# Patient Record
Sex: Female | Born: 1965 | Race: White | Hispanic: No | Marital: Married | Smoking: Never smoker
Health system: Southern US, Community
[De-identification: ages and names within clinical notes are randomized; demographics above are authoritative.]

## PROBLEM LIST (undated history)

## (undated) DIAGNOSIS — I10 Essential (primary) hypertension: Secondary | ICD-10-CM

## (undated) HISTORY — PX: TONSILLECTOMY: SUR1361

## (undated) HISTORY — DX: Essential (primary) hypertension: I10

---

## 2004-09-04 ENCOUNTER — Other Ambulatory Visit: Admission: RE | Admit: 2004-09-04 | Discharge: 2004-09-04 | Payer: Self-pay | Admitting: Obstetrics and Gynecology

## 2008-01-02 ENCOUNTER — Encounter: Admission: RE | Admit: 2008-01-02 | Discharge: 2008-01-02 | Payer: Self-pay | Admitting: Obstetrics and Gynecology

## 2010-12-31 ENCOUNTER — Encounter: Payer: Self-pay | Admitting: Obstetrics and Gynecology

## 2016-03-01 ENCOUNTER — Other Ambulatory Visit: Payer: Self-pay | Admitting: Physician Assistant

## 2016-03-01 DIAGNOSIS — N63 Unspecified lump in unspecified breast: Secondary | ICD-10-CM

## 2016-03-07 ENCOUNTER — Ambulatory Visit
Admission: RE | Admit: 2016-03-07 | Discharge: 2016-03-07 | Disposition: A | Discharge status: Home / Self Care | Payer: Managed Care, Other (non HMO) | Source: Ambulatory Visit | Attending: Physician Assistant | Admitting: Physician Assistant

## 2016-03-07 DIAGNOSIS — N63 Unspecified lump in unspecified breast: Secondary | ICD-10-CM

## 2016-07-16 ENCOUNTER — Other Ambulatory Visit: Payer: Self-pay | Admitting: Family Medicine

## 2016-11-16 ENCOUNTER — Other Ambulatory Visit: Payer: Self-pay | Admitting: Family Medicine

## 2019-07-20 ENCOUNTER — Other Ambulatory Visit: Payer: Self-pay

## 2019-07-21 ENCOUNTER — Ambulatory Visit: Payer: Managed Care, Other (non HMO) | Admitting: Women's Health

## 2019-07-21 ENCOUNTER — Encounter: Payer: Self-pay | Admitting: Women's Health

## 2019-07-21 VITALS — BP 128/80 | Ht 65.0 in | Wt 151.0 lb

## 2019-07-21 DIAGNOSIS — Z01419 Encounter for gynecological examination (general) (routine) without abnormal findings: Secondary | ICD-10-CM | POA: Diagnosis not present

## 2019-07-21 NOTE — Progress Notes (Signed)
Patricia Rhodes 1966-11-10 [id number]    History:    Presents for annual exam.  Postmenopausal greater than 1 year on no HRT with no bleeding.  Primary care manages hypertension and low vitamin D.  Recent labs.  Reports normal Pap and mammogram history.  Has not had a screening colonoscopy.  Past medical history, past surgical history, family history and social history were all reviewed and documented in the EPIC chart.  Recently laid off from [employer].  3 children ages 55, 45 and 55 all doing well.  Originally from [place].  Mother deceased from breast cancer.  ROS:  A ROS was performed and pertinent positives and negatives are included.  Exam:  Vitals:   07/21/19 1413  BP: 128/80  Weight: 151 lb (68.5 kg)  Height: 5\' 5"  (1.651 m)   Body mass index is 25.13 kg/m.   General appearance:  Normal Thyroid:  Symmetrical, normal in size, without palpable masses or nodularity. Respiratory  Auscultation:  Clear without wheezing or rhonchi Cardiovascular  Auscultation:  Regular rate, without rubs, murmurs or gallops  Edema/varicosities:  Not grossly evident Abdominal  Soft,nontender, without masses, guarding or rebound.  Liver/spleen:  No organomegaly noted  Hernia:  None appreciated  Skin  Inspection:  Grossly normal   Breasts: Examined lying and sitting.     Right: Without masses, retractions, discharge or axillary adenopathy.     Left: Without masses, retractions, discharge or axillary adenopathy. Gentitourinary   Inguinal/mons:  Normal without inguinal adenopathy  External genitalia:  Normal  BUS/Urethra/Skene's glands:  Normal  Vagina:  Normal  Cervix:  Normal  Uterus:   normal in size, shape and contour.  Midline and mobile  Adnexa/parametria:     Rt: Without masses or tenderness.   Lt: Without masses or tenderness.  Anus and perineum: Normal  Digital rectal exam: Normal sphincter tone without palpated masses or tenderness  Assessment/Plan:  53 y.o. MWF G3, P3 for  annual exam with no complaints.  Postmenopausal/no HRT/no bleeding Hypertension, low vitamin D-primary care manages labs and meds  Plan: SBEs, reviewed importance of annual screening mammogram overdue, has had a breast center in the past breast center number given instructed to schedule.  Encouraged r cardio /weightbearing and balance type exercise, after completing vitamin D 50,000 for 12 weeks encouraged to take 2000 IUs daily, calcium rich foods.  Review DEXA will get after menopausal for few years.  Screening colonoscopy discussed [facility] information given instructed to schedule.  Pap with HR HPV typing, new screening guidelines reviewed.[name] [facility], 2:31 PM 07/21/2019

## 2019-07-21 NOTE — Patient Instructions (Addendum)
Colonoscopy  [facility] Dr Carlean Purl  [phone] Breast mammogram  [phone]  Health Maintenance for Postmenopausal Women Menopause is a normal process in which your ability to get pregnant comes to an end. This process happens slowly over many months or years, usually between the ages of 64 and 24. Menopause is complete when you have missed your menstrual periods for 12 months. It is important to talk with your health care provider about some of the most common conditions that affect women after menopause (postmenopausal women). These include heart disease, cancer, and bone loss (osteoporosis). Adopting a healthy lifestyle and getting preventive care can help to promote your health and wellness. The actions you take can also lower your chances of developing some of these common conditions. What should I know about menopause? During menopause, you may get a number of symptoms, such as:  Hot flashes. These can be moderate or severe.  Night sweats.  Decrease in sex drive.  Mood swings.  Headaches.  Tiredness.  Irritability.  Memory problems.  Insomnia. Choosing to treat or not to treat these symptoms is a decision that you make with your health care provider. Do I need hormone replacement therapy?  Hormone replacement therapy is effective in treating symptoms that are caused by menopause, such as hot flashes and night sweats.  Hormone replacement carries certain risks, especially as you become older. If you are thinking about using estrogen or estrogen with progestin, discuss the benefits and risks with your health care provider. What is my risk for heart disease and stroke? The risk of heart disease, heart attack, and stroke increases as you age. One of the causes may be a change in the body's hormones during menopause. This can affect how your body uses dietary fats, triglycerides, and cholesterol. Heart attack and stroke are medical emergencies. There are many things that you can do to  help prevent heart disease and stroke. Watch your blood pressure  High blood pressure causes heart disease and increases the risk of stroke. This is more likely to develop in people who have high blood pressure readings, are of African descent, or are overweight.  Have your blood pressure checked: ? Every 3-5 years if you are 73-17 years of age. ? Every year if you are 18 years old or older. Eat a healthy diet   Eat a diet that includes plenty of vegetables, fruits, low-fat dairy products, and lean protein.  Do not eat a lot of foods that are high in solid fats, added sugars, or sodium. Get regular exercise Get regular exercise. This is one of the most important things you can do for your health. Most adults should:  Try to exercise for at least 150 minutes each week. The exercise should increase your heart rate and make you sweat (moderate-intensity exercise).  Try to do strengthening exercises at least twice each week. Do these in addition to the moderate-intensity exercise.  Spend less time sitting. Even light physical activity can be beneficial. Other tips  Work with your health care provider to achieve or maintain a healthy weight.  Do not use any products that contain nicotine or tobacco, such as cigarettes, e-cigarettes, and chewing tobacco. If you need help quitting, ask your health care provider.  Know your numbers. Ask your health care provider to check your cholesterol and your blood sugar (glucose). Continue to have your blood tested as directed by your health care provider. Do I need screening for cancer? Depending on your health history and family history, you  may need to have cancer screening at different stages of your life. This may include screening for:  Breast cancer.  Cervical cancer.  Lung cancer.  Colorectal cancer. What is my risk for osteoporosis? After menopause, you may be at increased risk for osteoporosis. Osteoporosis is a condition in which bone  destruction happens more quickly than new bone creation. To help prevent osteoporosis or the bone fractures that can happen because of osteoporosis, you may take the following actions:  If you are 42-4 years old, get at least 1,000 mg of calcium and at least 600 mg of vitamin D per day.  If you are older than age 44 but younger than age 20, get at least 1,200 mg of calcium and at least 600 mg of vitamin D per day.  If you are older than age 58, get at least 1,200 mg of calcium and at least 800 mg of vitamin D per day. Smoking and drinking excessive alcohol increase the risk of osteoporosis. Eat foods that are rich in calcium and vitamin D, and do weight-bearing exercises several times each week as directed by your health care provider. How does menopause affect my mental health? Depression may occur at any age, but it is more common as you become older. Common symptoms of depression include:  Low or sad mood.  Changes in sleep patterns.  Changes in appetite or eating patterns.  Feeling an overall lack of motivation or enjoyment of activities that you previously enjoyed.  Frequent crying spells. Talk with your health care provider if you think that you are experiencing depression. General instructions See your health care provider for regular wellness exams and vaccines. This may include:  Scheduling regular health, dental, and eye exams.  Getting and maintaining your vaccines. These include: ? Influenza vaccine. Get this vaccine each year before the flu season begins. ? Pneumonia vaccine. ? Shingles vaccine. ? Tetanus, diphtheria, and pertussis (Tdap) booster vaccine. Your health care provider may also recommend other immunizations. Tell your health care provider if you have ever been abused or do not feel safe at home. Summary  Menopause is a normal process in which your ability to get pregnant comes to an end.  This condition causes hot flashes, night sweats, decreased  interest in sex, mood swings, headaches, or lack of sleep.  Treatment for this condition may include hormone replacement therapy.  Take actions to keep yourself healthy, including exercising regularly, eating a healthy diet, watching your weight, and checking your blood pressure and blood sugar levels.  Get screened for cancer and depression. Make sure that you are up to date with all your vaccines. This information is not intended to replace advice given to you by your health care provider. Make sure you discuss any questions you have with your health care provider. Document Released: 01/18/2006 Document Revised: 11/19/2018 Document Reviewed: 11/19/2018 Elsevier Patient Education  [place]. Human Papillomavirus Quadrivalent Vaccine suspension for injection What is this medicine? HUMAN PAPILLOMAVIRUS VACCINE (HYOO muhn pap uh LOH muh vahy ruhs vak SEEN) is a vaccine. It is used to prevent infections of four types of the human papillomavirus. In women, the vaccine may lower your risk of getting cervical, vaginal, vulvar, or anal cancer and genital warts. In men, the vaccine may lower your risk of getting genital warts and anal cancer. You cannot get these diseases from the vaccine. This vaccine does not treat these diseases. This medicine may be used for other purposes; ask your health care provider or pharmacist  if you have questions. COMMON BRAND NAME(S): Gardasil What should I tell my health care provider before I take this medicine? They need to know if you have any of these conditions:  fever or infection  hemophilia  HIV infection or AIDS  immune system problems  low platelet count  an unusual reaction to Human Papillomavirus Vaccine, yeast, other medicines, foods, dyes, or preservatives  pregnant or trying to get pregnant  breast-feeding How should I use this medicine? This vaccine is for injection in a muscle on your upper arm or thigh. It is given by a health care  professional. Dennis Bast will be observed for 15 minutes after each dose. Sometimes, fainting happens after the vaccine is given. You may be asked to sit or lie down during the 15 minutes. Three doses are given. The second dose is given 2 months after the first dose. The last dose is given 4 months after the second dose. A copy of a Vaccine Information Statement will be given before each vaccination. Read this sheet carefully each time. The sheet may change frequently. Talk to your pediatrician regarding the use of this medicine in children. While this drug may be prescribed for children as Lamica Mccart as 47 years of age for selected conditions, precautions do apply. Overdosage: If you think you have taken too much of this medicine contact a poison control center or emergency room at once. NOTE: This medicine is only for you. Do not share this medicine with others. What if I miss a dose? All 3 doses of the vaccine should be given within 6 months. Remember to keep appointments for follow-up doses. Your health care provider will tell you when to return for the next vaccine. Ask your health care professional for advice if you are unable to keep an appointment or miss a scheduled dose. What may interact with this medicine?  other vaccines This list may not describe all possible interactions. Give your health care provider a list of all the medicines, herbs, non-prescription drugs, or dietary supplements you use. Also tell them if you smoke, drink alcohol, or use illegal drugs. Some items may interact with your medicine. What should I watch for while using this medicine? This vaccine may not fully protect everyone. Continue to have regular pelvic exams and cervical or anal cancer screenings as directed by your doctor. The Human Papillomavirus is a sexually transmitted disease. It can be passed by any kind of sexual activity that involves genital contact. The vaccine works best when given before you have any contact with  the virus. Many people who have the virus do not have any signs or symptoms. Tell your doctor or health care professional if you have any reaction or unusual symptom after getting the vaccine. What side effects may I notice from receiving this medicine? Side effects that you should report to your doctor or health care professional as soon as possible:  allergic reactions like skin rash, itching or hives, swelling of the face, lips, or tongue  breathing problems  feeling faint or lightheaded, falls Side effects that usually do not require medical attention (report to your doctor or health care professional if they continue or are bothersome):  cough  dizziness  fever  headache  nausea  redness, warmth, swelling, pain, or itching at site where injected This list may not describe all possible side effects. Call your doctor for medical advice about side effects. You may report side effects to FDA at 1-800-FDA-1088. Where should I keep my medicine? This  drug is given in a hospital or clinic and will not be stored at home. NOTE: This sheet is a summary. It may not cover all possible information. If you have questions about this medicine, talk to your doctor, pharmacist, or health care provider.  2020 Elsevier/Gold Standard (2014-01-18 13:14:33)

## 2019-07-22 LAB — PAP, TP IMAGING W/ HPV RNA, RFLX HPV TYPE 16,18/45: HPV DNA High Risk: NOT DETECTED

## 2020-02-02 ENCOUNTER — Other Ambulatory Visit: Payer: Self-pay | Admitting: Family Medicine

## 2020-02-02 DIAGNOSIS — Z1231 Encounter for screening mammogram for malignant neoplasm of breast: Secondary | ICD-10-CM

## 2020-02-03 ENCOUNTER — Other Ambulatory Visit: Payer: Self-pay

## 2020-02-03 ENCOUNTER — Ambulatory Visit
Admission: RE | Admit: 2020-02-03 | Discharge: 2020-02-03 | Disposition: A | Discharge status: Home / Self Care | Payer: Managed Care, Other (non HMO) | Source: Ambulatory Visit | Attending: Family Medicine | Admitting: Family Medicine

## 2020-02-03 DIAGNOSIS — Z1231 Encounter for screening mammogram for malignant neoplasm of breast: Secondary | ICD-10-CM

## 2021-01-02 ENCOUNTER — Other Ambulatory Visit: Payer: Self-pay | Admitting: Family Medicine

## 2021-01-02 DIAGNOSIS — Z1231 Encounter for screening mammogram for malignant neoplasm of breast: Secondary | ICD-10-CM

## 2021-02-13 ENCOUNTER — Ambulatory Visit: Payer: Managed Care, Other (non HMO)

## 2021-03-31 ENCOUNTER — Other Ambulatory Visit: Payer: Self-pay

## 2021-03-31 ENCOUNTER — Ambulatory Visit
Admission: RE | Admit: 2021-03-31 | Discharge: 2021-03-31 | Disposition: A | Discharge status: Home / Self Care | Payer: No Typology Code available for payment source | Source: Ambulatory Visit | Attending: Family Medicine | Admitting: Family Medicine

## 2021-03-31 DIAGNOSIS — Z1231 Encounter for screening mammogram for malignant neoplasm of breast: Secondary | ICD-10-CM

## 2022-10-15 IMAGING — MG MM DIGITAL SCREENING BILAT W/ TOMO AND CAD
7 series · 8 of 23 positions shown · non-contrast
Comparison: Previous exam(s).

CLINICAL DATA: Screening.

EXAM:
DIGITAL SCREENING BILATERAL MAMMOGRAM WITH TOMOSYNTHESIS AND CAD
TECHNIQUE: Bilateral screening digital craniocaudal and mediolateral oblique
mammograms were obtained. Bilateral screening digital breast
tomosynthesis was performed. The images were evaluated with
computer-aided detection.

[L MLO synth-2D (1 of 2)]
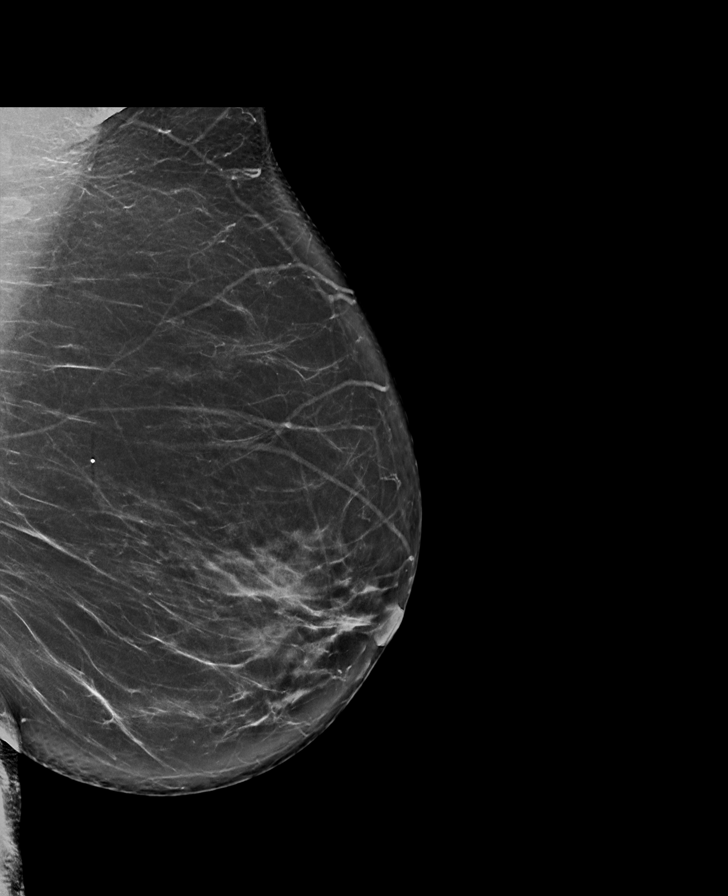

[L MLO synth-2D (2 of 2)]
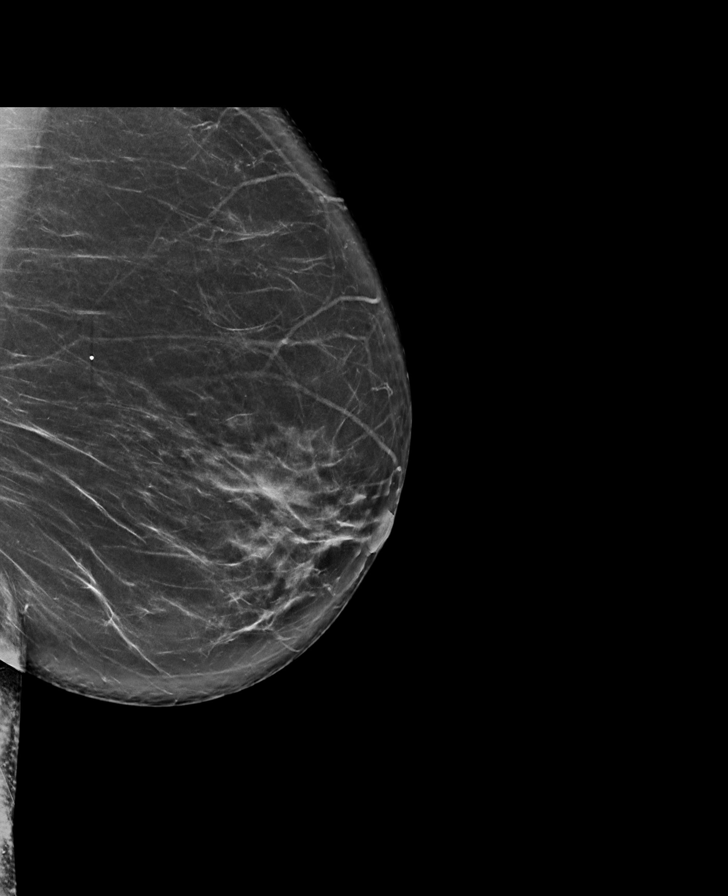

[L CC synth-2D]
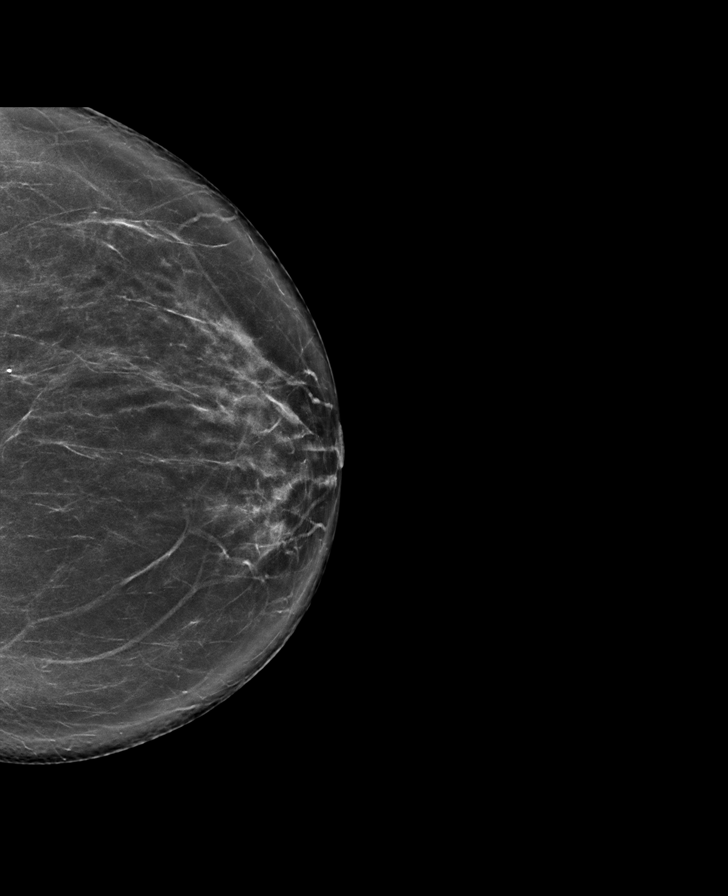

[L MLO tomo · 2 of 87 frames shown (1 of 2)]
[frame 29/87]
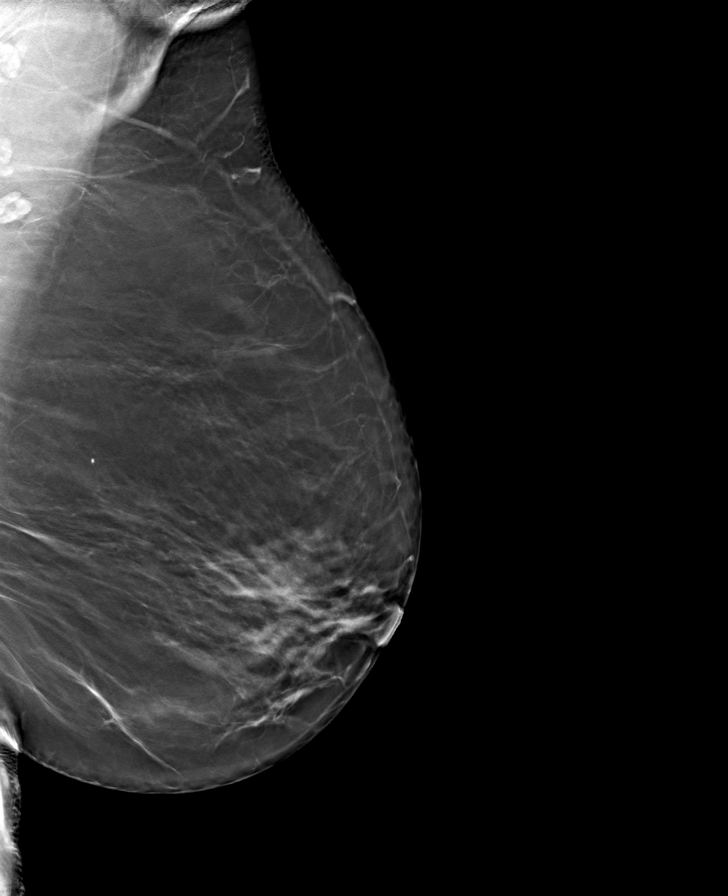
[frame 44/87]
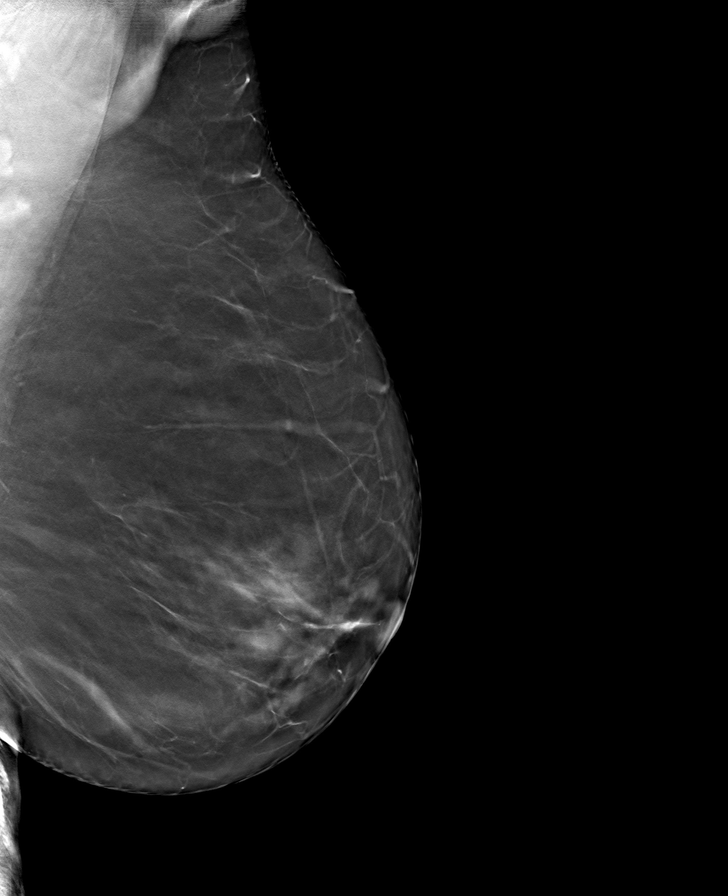

[L MLO tomo (2 of 2) · tomo slice 44/87.0]
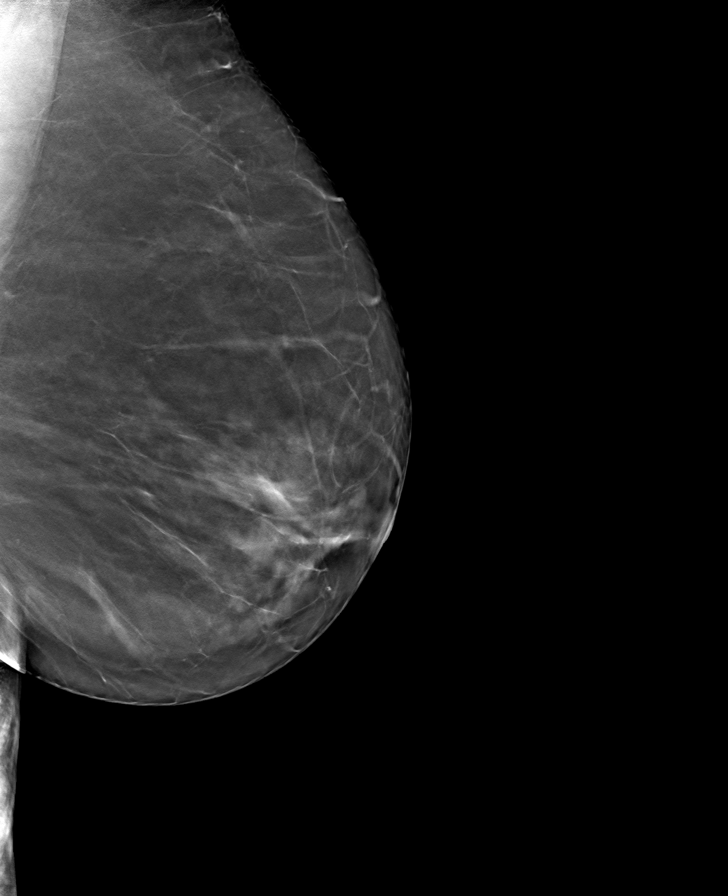

[R MLO tomo · tomo slice 46/91.0]
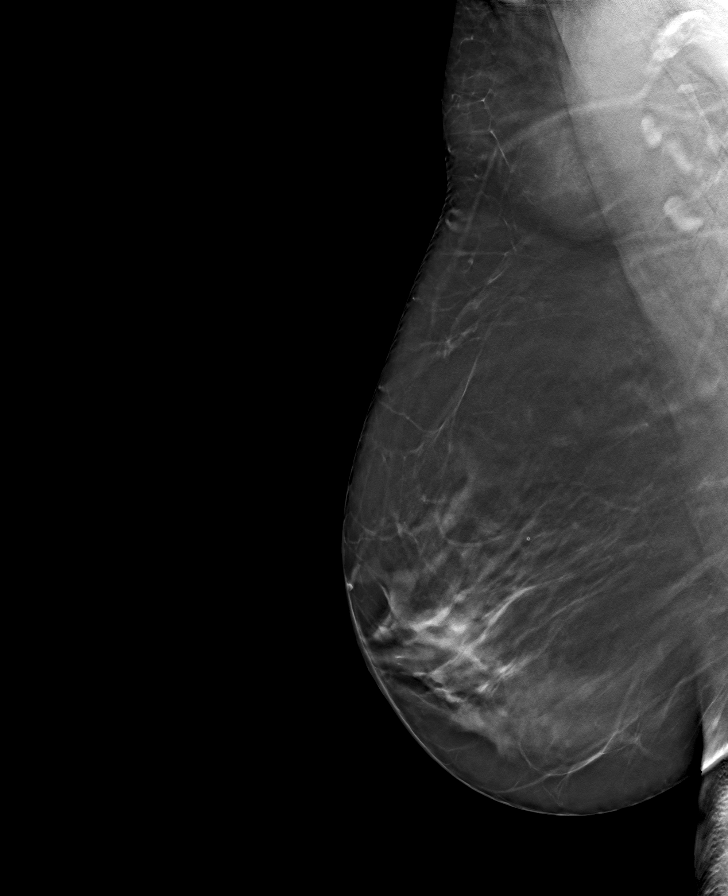

[R CC tomo · tomo slice 43/85.0]
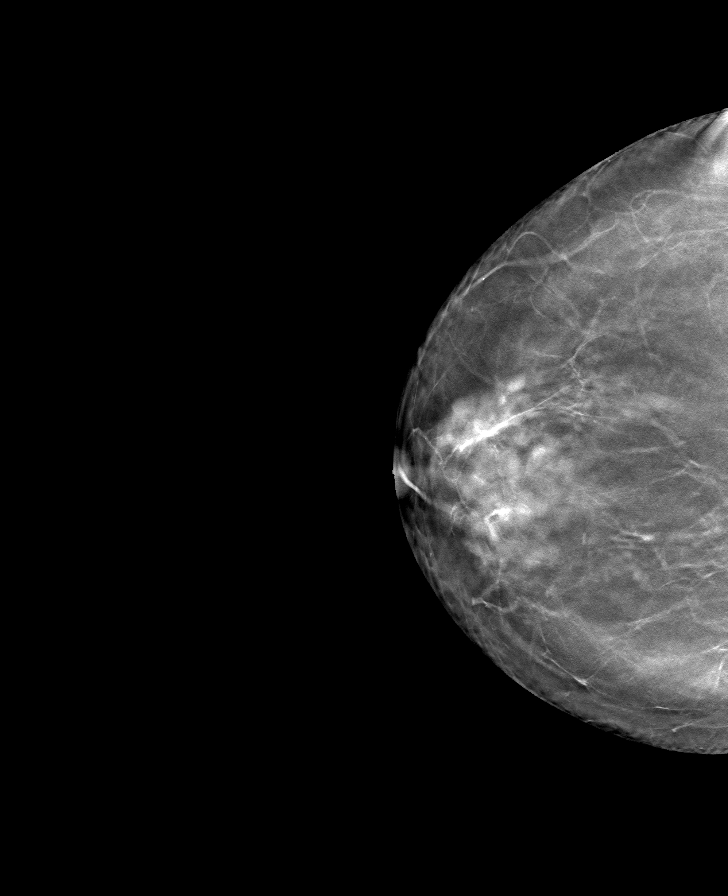

[8 of 23 positions shown; findings below may reference images not displayed]

ACR Breast Density Category c: The breast tissue is heterogeneously
dense, which may obscure small masses.
FINDINGS: There are no findings suspicious for malignancy. The images were
evaluated with computer-aided detection.
IMPRESSION: No mammographic evidence of malignancy. A result letter of this
screening mammogram will be mailed directly to the patient.

RECOMMENDATION:
Screening mammogram in one year. (Code:T4-5-GWO)

BI-RADS CATEGORY  1: Negative.
